# Patient Record
Sex: Female | Born: 1960 | Race: White | Hispanic: No | Marital: Single | Smoking: Never smoker
Health system: Southern US, Community
[De-identification: ages and names within clinical notes are randomized; demographics above are authoritative.]

## PROBLEM LIST (undated history)

## (undated) DIAGNOSIS — R011 Cardiac murmur, unspecified: Secondary | ICD-10-CM

## (undated) DIAGNOSIS — I341 Nonrheumatic mitral (valve) prolapse: Secondary | ICD-10-CM

## (undated) DIAGNOSIS — F329 Major depressive disorder, single episode, unspecified: Secondary | ICD-10-CM

## (undated) DIAGNOSIS — F419 Anxiety disorder, unspecified: Secondary | ICD-10-CM

## (undated) DIAGNOSIS — Z8679 Personal history of other diseases of the circulatory system: Secondary | ICD-10-CM

## (undated) DIAGNOSIS — J439 Emphysema, unspecified: Secondary | ICD-10-CM

## (undated) DIAGNOSIS — F32A Depression, unspecified: Secondary | ICD-10-CM

## (undated) DIAGNOSIS — K649 Unspecified hemorrhoids: Secondary | ICD-10-CM

## (undated) DIAGNOSIS — K219 Gastro-esophageal reflux disease without esophagitis: Secondary | ICD-10-CM

## (undated) HISTORY — DX: Major depressive disorder, single episode, unspecified: F32.9

## (undated) HISTORY — DX: Anxiety disorder, unspecified: F41.9

## (undated) HISTORY — DX: Emphysema, unspecified: J43.9

## (undated) HISTORY — DX: Unspecified hemorrhoids: K64.9

## (undated) HISTORY — PX: ELBOW SURGERY: SHX618

## (undated) HISTORY — DX: Personal history of other diseases of the circulatory system: Z86.79

## (undated) HISTORY — DX: Depression, unspecified: F32.A

## (undated) HISTORY — PX: NOSE SURGERY: SHX723

## (undated) HISTORY — DX: Nonrheumatic mitral (valve) prolapse: I34.1

## (undated) HISTORY — DX: Gastro-esophageal reflux disease without esophagitis: K21.9

## (undated) HISTORY — DX: Cardiac murmur, unspecified: R01.1

---

## 2006-04-23 ENCOUNTER — Other Ambulatory Visit: Admission: RE | Admit: 2006-04-23 | Discharge: 2006-04-23 | Payer: Self-pay | Admitting: Gynecology

## 2006-05-22 ENCOUNTER — Encounter: Admission: RE | Admit: 2006-05-22 | Discharge: 2006-05-22 | Payer: Self-pay | Admitting: Gynecology

## 2006-05-30 ENCOUNTER — Encounter: Admission: RE | Admit: 2006-05-30 | Discharge: 2006-05-30 | Payer: Self-pay | Admitting: Gynecology

## 2008-12-05 ENCOUNTER — Encounter: Payer: Self-pay | Admitting: Cardiology

## 2008-12-11 ENCOUNTER — Encounter: Payer: Self-pay | Admitting: Cardiology

## 2008-12-29 ENCOUNTER — Ambulatory Visit: Payer: Self-pay | Admitting: Cardiology

## 2008-12-29 DIAGNOSIS — R011 Cardiac murmur, unspecified: Secondary | ICD-10-CM | POA: Insufficient documentation

## 2009-01-12 ENCOUNTER — Ambulatory Visit (HOSPITAL_COMMUNITY): Admission: RE | Admit: 2009-01-12 | Discharge: 2009-01-12 | Payer: Self-pay | Admitting: Cardiology

## 2009-01-12 ENCOUNTER — Ambulatory Visit: Payer: Self-pay

## 2009-01-12 ENCOUNTER — Ambulatory Visit: Payer: Self-pay | Admitting: Cardiology

## 2009-01-12 ENCOUNTER — Encounter: Payer: Self-pay | Admitting: Cardiology

## 2009-06-24 ENCOUNTER — Emergency Department (HOSPITAL_COMMUNITY): Admission: EM | Admit: 2009-06-24 | Discharge: 2009-06-24 | Payer: Self-pay | Admitting: Emergency Medicine

## 2010-02-25 ENCOUNTER — Ambulatory Visit: Payer: Self-pay | Admitting: Cardiology

## 2010-03-14 ENCOUNTER — Telehealth: Payer: Self-pay | Admitting: Cardiology

## 2010-03-16 ENCOUNTER — Telehealth: Payer: Self-pay | Admitting: Cardiology

## 2010-03-18 ENCOUNTER — Ambulatory Visit (HOSPITAL_COMMUNITY)
Admission: RE | Admit: 2010-03-18 | Discharge: 2010-03-18 | Payer: Self-pay | Source: Home / Self Care | Attending: Cardiology | Admitting: Cardiology

## 2010-03-18 ENCOUNTER — Ambulatory Visit: Admission: RE | Admit: 2010-03-18 | Discharge: 2010-03-18 | Payer: Self-pay | Source: Home / Self Care

## 2010-03-21 ENCOUNTER — Telehealth: Payer: Self-pay | Admitting: Cardiology

## 2010-03-31 NOTE — Progress Notes (Signed)
Summary: pt rtn your call  Phone Note Call from Patient Call back at Home Phone [phone]   Caller: Patient Reason for Call: Talk to Nurse, Talk to Doctor, Lab or Test Results Summary of Call: pt rtn your call Initial call taken by: Omer Jack,  March 21, 2010 4:01 PM  Follow-up for Phone Call        pt given echo results from 03/18/10

## 2010-03-31 NOTE — Assessment & Plan Note (Signed)
Summary: [id number].gd   Visit Type:  Follow-up   History of Present Illness: 50 yo with history of cardiac murmur and mitral valve prolapse returns for followup.  Patient has had a cardiac murmur that has been known for about 20 years.  It was initially found when she was pregnant.  She has had a cardiac workup prior in [place] about 4 years ago.  She had chest pain and was worked up with a stress test that per her report was negative.  She has excellent exercise tolerance.  No significant exertional chest pain or shortness of breath.  Patient does have GERD symptoms after certain foods such as tomatoes.  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    Labs (10/10): K 4.7, creatinine 0.77, LDL 72, HDL 70, TGs 87  Current Medications (verified): 1)  Oxycodone-Acetaminophen .... Uad 2)  Prilosec 40 Mg Cpdr (Omeprazole) .... Once Daily  Allergies (verified): No Known Drug Allergies  Past History:  Past Medical History: 1. GERD 2. 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   Family History: Reviewed history from 12/29/2008 and no changes required. No premature CAD  Social History: Reviewed history from 12/29/2008 and no changes required. Never smoked, lives in [place], works for [employer], one child  Vital Signs:  Patient profile:   50 year old female Height:      68 inches Weight:      123 pounds BMI:     18.77 Pulse rate:   68 / minute BP sitting:   88 / 60  (left arm)  Vitals Entered By: Laurance Flatten CMA (February 25, 2010 9:27 AM)  Physical Exam  General:  Well developed, well nourished, in no acute distress. Neck:  Neck supple, no JVD. No masses, thyromegaly or abnormal cervical nodes. Lungs:  Clear bilaterally to auscultation and percussion. Heart:  Non-displaced PMI, chest non-tender; regular rate and rhythm, S1, S2 without rubs or gallops. There is a systolic click  with as slight mid to late systolic murmur at the apex.  Carotid upstroke normal, no bruit.  Pedals normal pulses. No edema, no varicosities. Abdomen:  Bowel sounds positive; abdomen soft and non-tender without masses, organomegaly, or hernias noted. No hepatosplenomegaly. Extremities:  No clubbing or cyanosis. Neurologic:  Alert and oriented x 3. Psych:  Normal affect.   Impression & Recommendations:  Problem # 1:  CARDIAC MURMUR (ICD-785.2) MItral valve prolapse with trivial MR.  Would followup echo in 11/12 (2-year interval).  May otherwise followup as needed.   Patient Instructions: 1)  Your physician recommends that you schedule a follow-up appointment as needed 2)  Your physician has requested that you have an echocardiogram in November 2012 for MVP/MR.  Echocardiography is a painless test that uses sound waves to create images of your heart. It provides your doctor with information about the size and shape of your heart and how well your heart's chambers and valves are working.  This procedure takes approximately one hour. There are no restrictions for this procedure.

## 2010-03-31 NOTE — Progress Notes (Signed)
Summary: wants to be set up -ekg  Phone Note Call from Patient Call back at Home Phone [phone]   Caller: Patient Reason for Call: Talk to Nurse Summary of Call: wants to be set up for ekg.  Initial call taken by: Lorne Skeens,  March 14, 2010 1:08 PM  Follow-up for Phone Call        [facility] Joyce Weaver --I talked with pt --pt saw Dr Shirlee Latch 02/25/10--he recommended pt have repeat echo November 2012-(-Pt had last  echo 01/12/09)--pt requesting to have echo now because she has insurance and she is not sure she is going to have insurance November  2012--I will forward to Dr Shirlee Latch for review     Appended Document: wants to be set up -ekg That will be fine if she thinks she will lose her insurance.   Appended Document: wants to be set up -ekg see phone note 03/16/10--echo scheduled for 03/18/10

## 2010-03-31 NOTE — Progress Notes (Signed)
Summary: Echo scheduled for 03/18/10  Phone Note Call from Patient Call back at Home Phone [phone]   Caller: Patient Reason for Call: Talk to Nurse Summary of Call: pt wants to talk to a nurse. pt does not want to leave a message with scheduler pt states she only wants to talk to a nurse. Initial call taken by: Roe Coombs,  March 16, 2010 10:58 AM  Follow-up for Phone Call        Returned call to patient-patient wants to have Echo now instead of wait until November 2012 due to losing insurance at end of month.  Appt scheduled for Echo on Friday 03/18/10 at 3pm and per [place] her insurance does not require her to pay a co-pay. Follow-up by: Dessie Coma  LPN,  March 16, 2010 11:27 AM

## 2010-05-17 LAB — POCT I-STAT, CHEM 8
Calcium, Ion: 1.22 mmol/L (ref 1.12–1.32)
Glucose, Bld: 127 mg/dL — ABNORMAL HIGH (ref 70–99)
HCT: 43 % (ref 36.0–46.0)
Hemoglobin: 14.6 g/dL (ref 12.0–15.0)
TCO2: 26 mmol/L (ref 0–100)

## 2010-10-11 ENCOUNTER — Encounter: Payer: Self-pay | Admitting: Cardiology

## 2012-04-04 ENCOUNTER — Other Ambulatory Visit: Payer: Self-pay | Admitting: Obstetrics and Gynecology

## 2012-04-04 DIAGNOSIS — R928 Other abnormal and inconclusive findings on diagnostic imaging of breast: Secondary | ICD-10-CM

## 2013-01-01 ENCOUNTER — Ambulatory Visit (INDEPENDENT_AMBULATORY_CARE_PROVIDER_SITE_OTHER): Payer: BC Managed Care – PPO | Admitting: Podiatry

## 2013-01-01 ENCOUNTER — Encounter: Payer: Self-pay | Admitting: Podiatry

## 2013-01-01 ENCOUNTER — Ambulatory Visit (INDEPENDENT_AMBULATORY_CARE_PROVIDER_SITE_OTHER): Payer: BC Managed Care – PPO

## 2013-01-01 VITALS — BP 104/60 | HR 71 | Resp 16 | Ht 68.0 in | Wt 130.0 lb

## 2013-01-01 DIAGNOSIS — M722 Plantar fascial fibromatosis: Secondary | ICD-10-CM

## 2013-01-01 DIAGNOSIS — M79675 Pain in left toe(s): Secondary | ICD-10-CM

## 2013-01-01 DIAGNOSIS — M79609 Pain in unspecified limb: Secondary | ICD-10-CM

## 2013-01-01 DIAGNOSIS — G589 Mononeuropathy, unspecified: Secondary | ICD-10-CM

## 2013-01-01 NOTE — Progress Notes (Signed)
Subjective:     Patient ID: Joyce Weaver, female   DOB: April 10, 1960, 52 y.o.   MRN: [id number]  Toe Pain    patient points to the bottom of her left big toe states it has been tingly and down and she's not sure why. States has been there for a while but it has gotten more tingly in the last 4 weeks also complains of long-term plantar fasciitis right heel over left   Review of Systems  All other systems reviewed and are negative.       Objective:   Physical Exam  Nursing note and vitals reviewed. Constitutional: She is oriented to person, place, and time.  Cardiovascular: Intact distal pulses.   Abdominal: Tenderness: patient is found to have no loss of motion of the first MPJ both feet. No indications of nerve loss sharp vibratory intact bilateral and also muscle strength found to be normal and symmetrical on both feet.  Musculoskeletal: Normal range of motion.  Neurological: She is oriented to person, place, and time.  Skin: Skin is warm.       Assessment:    possible nerve contusion left versus possibility of back eructation or other neurological issue. Plantar fasciitis of a low-grade both feet    Plan:     H&P and x-rays reviewed. At this point I have recommended stretching exercises which were given to patient and monitoring her left big toe hopeful that we'll gradually get better scanned for custom orthotic devices at this time

## 2013-01-01 NOTE — Progress Notes (Signed)
  Subjective:    Patient ID: Joyce Weaver, female    DOB: 06-28-1960, 53 y.o.   MRN: [id number]  HPI Comments: Plantar 1st toe left  N - numbness, tingling L - Plantar 1st toe left D - 52yr O - gradual C - noticed worsened in [place] doing a lot of walking in flip flops, no injury A - walking T - none  **Pt states she does have plantar fasciitis that comes and goes on right foot**      Review of Systems  HENT: Positive for sinus pressure, sore throat and trouble swallowing.   Respiratory: Positive for cough.   Musculoskeletal: Positive for back pain and myalgias.  Neurological: Positive for headaches.       Objective:   Physical Exam        Assessment & Plan:

## 2013-01-01 NOTE — Patient Instructions (Signed)
Plantar Fasciitis (Heel Spur Syndrome)  with Rehab  The plantar fascia is a fibrous, ligament-like, soft-tissue structure that spans the bottom of the foot. Plantar fasciitis is a condition that causes pain in the foot due to inflammation of the tissue.  SYMPTOMS   · Pain and tenderness on the underneath side of the foot.  · Pain that worsens with standing or walking.  CAUSES   Plantar fasciitis is caused by irritation and injury to the plantar fascia on the underneath side of the foot. Common mechanisms of injury include:  · Direct trauma to bottom of the foot.  · Damage to a small nerve that runs under the foot where the main fascia attaches to the heel bone.  · Stress placed on the plantar fascia due to bone spurs.  RISK INCREASES WITH:   · Activities that place stress on the plantar fascia (running, jumping, pivoting, or cutting).  · Poor strength and flexibility.  · Improperly fitted shoes.  · Tight calf muscles.  · Flat feet.  · Failure to warm-up properly before activity.  · Obesity.  PREVENTION  · Warm up and stretch properly before activity.  · Allow for adequate recovery between workouts.  · Maintain physical fitness:  · Strength, flexibility, and endurance.  · Cardiovascular fitness.  · Maintain a health body weight.  · Avoid stress on the plantar fascia.  · Wear properly fitted shoes, including arch supports for individuals who have flat feet.  PROGNOSIS   If treated properly, then the symptoms of plantar fasciitis usually resolve without surgery. However, occasionally surgery is necessary.  RELATED COMPLICATIONS   · Recurrent symptoms that may result in a chronic condition.  · Problems of the lower back that are caused by compensating for the injury, such as limping.  · Pain or weakness of the foot during push-off following surgery.  · Chronic inflammation, scarring, and partial or complete fascia tear, occurring more often from repeated injections.  TREATMENT   Treatment initially involves the use of  ice and medication to help reduce pain and inflammation. The use of strengthening and stretching exercises may help reduce pain with activity, especially stretches of the Achilles tendon. These exercises may be performed at home or with a therapist. Your caregiver may recommend that you use heel cups of arch supports to help reduce stress on the plantar fascia. Occasionally, corticosteroid injections are given to reduce inflammation. If symptoms persist for greater than 6 months despite non-surgical (conservative), then surgery may be recommended.   MEDICATION   · If pain medication is necessary, then nonsteroidal anti-inflammatory medications, such as aspirin and ibuprofen, or other minor pain relievers, such as acetaminophen, are often recommended.  · Do not take pain medication within 7 days before surgery.  · Prescription pain relievers may be given if deemed necessary by your caregiver. Use only as directed and only as much as you need.  · Corticosteroid injections may be given by your caregiver. These injections should be reserved for the most serious cases, because they may only be given a certain number of times.  HEAT AND COLD  · Cold treatment (icing) relieves pain and reduces inflammation. Cold treatment should be applied for 10 to 15 minutes every 2 to 3 hours for inflammation and pain and immediately after any activity that aggravates your symptoms. Use ice packs or massage the area with a piece of ice (ice massage).  · Heat treatment may be used prior to performing the stretching and strengthening activities prescribed   by your caregiver, physical therapist, or athletic trainer. Use a heat pack or soak the injury in warm water.  SEEK IMMEDIATE MEDICAL CARE IF:  · Treatment seems to offer no benefit, or the condition worsens.  · Any medications produce adverse side effects.  EXERCISES  RANGE OF MOTION (ROM) AND STRETCHING EXERCISES - Plantar Fasciitis (Heel Spur Syndrome)  These exercises may help you  when beginning to rehabilitate your injury. Your symptoms may resolve with or without further involvement from your physician, physical therapist or athletic trainer. While completing these exercises, remember:   · Restoring tissue flexibility helps normal motion to return to the joints. This allows healthier, less painful movement and activity.  · An effective stretch should be held for at least 30 seconds.  · A stretch should never be painful. You should only feel a gentle lengthening or release in the stretched tissue.  RANGE OF MOTION - Toe Extension, Flexion  · Sit with your right / left leg crossed over your opposite knee.  · Grasp your toes and gently pull them back toward the top of your foot. You should feel a stretch on the bottom of your toes and/or foot.  · Hold this stretch for __________ seconds.  · Now, gently pull your toes toward the bottom of your foot. You should feel a stretch on the top of your toes and or foot.  · Hold this stretch for __________ seconds.  Repeat __________ times. Complete this stretch __________ times per day.   RANGE OF MOTION - Ankle Dorsiflexion, Active Assisted  · Remove shoes and sit on a chair that is preferably not on a carpeted surface.  · Place right / left foot under knee. Extend your opposite leg for support.  · Keeping your heel down, slide your right / left foot back toward the chair until you feel a stretch at your ankle or calf. If you do not feel a stretch, slide your bottom forward to the edge of the chair, while still keeping your heel down.  · Hold this stretch for __________ seconds.  Repeat __________ times. Complete this stretch __________ times per day.   STRETCH  Gastroc, Standing  · Place hands on wall.  · Extend right / left leg, keeping the front knee somewhat bent.  · Slightly point your toes inward on your back foot.  · Keeping your right / left heel on the floor and your knee straight, shift your weight toward the wall, not allowing your back to  arch.  · You should feel a gentle stretch in the right / left calf. Hold this position for __________ seconds.  Repeat __________ times. Complete this stretch __________ times per day.  STRETCH  Soleus, Standing  · Place hands on wall.  · Extend right / left leg, keeping the other knee somewhat bent.  · Slightly point your toes inward on your back foot.  · Keep your right / left heel on the floor, bend your back knee, and slightly shift your weight over the back leg so that you feel a gentle stretch deep in your back calf.  · Hold this position for __________ seconds.  Repeat __________ times. Complete this stretch __________ times per day.  STRETCH  Gastrocsoleus, Standing   Note: This exercise can place a lot of stress on your foot and ankle. Please complete this exercise only if specifically instructed by your caregiver.   · Place the ball of your right / left foot on a step, keeping   your other foot firmly on the same step.  · Hold on to the wall or a rail for balance.  · Slowly lift your other foot, allowing your body weight to press your heel down over the edge of the step.  · You should feel a stretch in your right / left calf.  · Hold this position for __________ seconds.  · Repeat this exercise with a slight bend in your right / left knee.  Repeat __________ times. Complete this stretch __________ times per day.   STRENGTHENING EXERCISES - Plantar Fasciitis (Heel Spur Syndrome)   These exercises may help you when beginning to rehabilitate your injury. They may resolve your symptoms with or without further involvement from your physician, physical therapist or athletic trainer. While completing these exercises, remember:   · Muscles can gain both the endurance and the strength needed for everyday activities through controlled exercises.  · Complete these exercises as instructed by your physician, physical therapist or athletic trainer. Progress the resistance and repetitions only as guided.  STRENGTH - Towel  Curls  · Sit in a chair positioned on a non-carpeted surface.  · Place your foot on a towel, keeping your heel on the floor.  · Pull the towel toward your heel by only curling your toes. Keep your heel on the floor.  · If instructed by your physician, physical therapist or athletic trainer, add ____________________ at the end of the towel.  Repeat __________ times. Complete this exercise __________ times per day.  STRENGTH - Ankle Inversion  · Secure one end of a rubber exercise band/tubing to a fixed object (table, pole). Loop the other end around your foot just before your toes.  · Place your fists between your knees. This will focus your strengthening at your ankle.  · Slowly, pull your big toe up and in, making sure the band/tubing is positioned to resist the entire motion.  · Hold this position for __________ seconds.  · Have your muscles resist the band/tubing as it slowly pulls your foot back to the starting position.  Repeat __________ times. Complete this exercises __________ times per day.   Document Released: 02/13/2005 Document Revised: 05/08/2011 Document Reviewed: 05/28/2008  [employer]® Patient Information ©2014 [employer].

## 2013-01-02 ENCOUNTER — Encounter (INDEPENDENT_AMBULATORY_CARE_PROVIDER_SITE_OTHER): Payer: Self-pay | Admitting: Surgery

## 2013-01-03 ENCOUNTER — Ambulatory Visit: Payer: Self-pay | Admitting: Podiatrist

## 2013-01-06 ENCOUNTER — Other Ambulatory Visit (INDEPENDENT_AMBULATORY_CARE_PROVIDER_SITE_OTHER): Payer: Self-pay | Admitting: Surgery

## 2013-01-06 ENCOUNTER — Ambulatory Visit (INDEPENDENT_AMBULATORY_CARE_PROVIDER_SITE_OTHER): Payer: BC Managed Care – PPO | Admitting: Surgery

## 2013-01-06 ENCOUNTER — Encounter (INDEPENDENT_AMBULATORY_CARE_PROVIDER_SITE_OTHER): Payer: Self-pay | Admitting: Surgery

## 2013-01-06 VITALS — BP 102/62 | HR 80 | Temp 98.6°F | Resp 14 | Ht 68.0 in | Wt 130.0 lb

## 2013-01-06 DIAGNOSIS — T189XXA Foreign body of alimentary tract, part unspecified, initial encounter: Secondary | ICD-10-CM

## 2013-01-06 DIAGNOSIS — IMO0002 Reserved for concepts with insufficient information to code with codable children: Secondary | ICD-10-CM

## 2013-01-06 NOTE — Progress Notes (Signed)
Subjective:     Patient ID: Joyce Weaver, female   DOB: [birth year], 52 y.o.   MRN: [id number]  HPI This is a pleasant female who was referred to me by the urologists Dr. Sherron Monday because of a recent CAT scan demonstrating a foreign body in the ascending colon as well as a intussusception of the small bowel. The patient denies ingesting a foreign body. She has absolutely no abdominal pain. This was a noncontrasted CAT scan  Review of Systems     Objective:   Physical Exam On exam, her abdomen is soft and nontender    Assessment:     Foreign body in the bowel     Plan:     I suspected by now she is passes foreign body. We will check a KUB to see if it has passed. If it has not, she will need to see her gastroenterologist for consideration of a repeat colonoscopy. I am not worried about intussusception of the small bowel as I suspect in the noncontrasted scan this was truly not present. We will notify her of the results of the KUB

## 2013-01-09 ENCOUNTER — Ambulatory Visit
Admission: RE | Admit: 2013-01-09 | Discharge: 2013-01-09 | Disposition: A | Discharge status: Home / Self Care | Payer: BC Managed Care – PPO | Source: Ambulatory Visit | Attending: Surgery | Admitting: Surgery

## 2013-01-09 DIAGNOSIS — IMO0002 Reserved for concepts with insufficient information to code with codable children: Secondary | ICD-10-CM

## 2013-01-10 ENCOUNTER — Telehealth (INDEPENDENT_AMBULATORY_CARE_PROVIDER_SITE_OTHER): Payer: Self-pay | Admitting: General Surgery

## 2013-01-10 ENCOUNTER — Other Ambulatory Visit (INDEPENDENT_AMBULATORY_CARE_PROVIDER_SITE_OTHER): Payer: Self-pay | Admitting: Surgery

## 2013-01-10 ENCOUNTER — Telehealth (INDEPENDENT_AMBULATORY_CARE_PROVIDER_SITE_OTHER): Payer: Self-pay | Admitting: *Deleted

## 2013-01-10 ENCOUNTER — Encounter: Payer: Self-pay | Admitting: *Deleted

## 2013-01-10 ENCOUNTER — Encounter: Payer: Self-pay | Admitting: Gastroenterology

## 2013-01-10 DIAGNOSIS — IMO0002 Reserved for concepts with insufficient information to code with codable children: Secondary | ICD-10-CM

## 2013-01-10 NOTE — Telephone Encounter (Signed)
Also please inform patient that Dr. Jarold Motto is retiring at the end of the year but she would be placed in the care of another physician at that time.

## 2013-01-10 NOTE — Telephone Encounter (Signed)
Pt called back and I informed her of her referral to a GI doctor for a colonoscopy.  She states that she had a previous colonoscopy at [facility] in [place]; however, she would like to go somewhere here in [place] which is closer to her.

## 2013-01-10 NOTE — Telephone Encounter (Signed)
LMOM for pt to return my call.  I was calling to inform her of her appt with Dr. Jarold Motto with [facility] on 01/14/13 with an arrival time of 9:00am.  They are located at [address].  Their phone number is [phone] if she needs to reschedule.  Please inform pt that she needs to come pick up the CD with her CT images from [facility] to take with her to [facility].

## 2013-01-10 NOTE — Telephone Encounter (Signed)
LMOM o have patient to call back and ask for [facility]. I need to let the patient know that she still has the foreign body in her colon, size 1.4 cm and she will need to be refer to a GI Doctor for a colonoscopy.

## 2013-01-13 NOTE — Telephone Encounter (Signed)
Pt returned my call and appt information below was given as well as instructions for her to have [facility] send a CD with the copy of the CT scan images over to [facility].  Pt is going to call [facility] to change appt because the appt given does not work with her schedule.

## 2013-01-14 ENCOUNTER — Ambulatory Visit: Payer: BC Managed Care – PPO | Admitting: Gastroenterology

## 2013-01-16 ENCOUNTER — Encounter: Payer: Self-pay | Admitting: Gastroenterology

## 2013-01-16 ENCOUNTER — Ambulatory Visit (INDEPENDENT_AMBULATORY_CARE_PROVIDER_SITE_OTHER): Payer: BC Managed Care – PPO | Admitting: Gastroenterology

## 2013-01-16 VITALS — BP 110/70 | HR 71 | Ht 68.0 in | Wt 130.6 lb

## 2013-01-16 DIAGNOSIS — T17900A Unspecified foreign body in respiratory tract, part unspecified causing asphyxiation, initial encounter: Secondary | ICD-10-CM

## 2013-01-16 DIAGNOSIS — Z86010 Personal history of colonic polyps: Secondary | ICD-10-CM

## 2013-01-16 DIAGNOSIS — T183XXA Foreign body in small intestine, initial encounter: Secondary | ICD-10-CM

## 2013-01-16 MED ORDER — NA SULFATE-K SULFATE-MG SULF 17.5-3.13-1.6 GM/177ML PO SOLN: ORAL | Status: DC

## 2013-01-16 NOTE — Patient Instructions (Signed)

## 2013-01-16 NOTE — Progress Notes (Addendum)
History of Present Illness:  This is a 52 year old  Caucasian female who was found to have a foreign body in her right lower quadrant on recent KUB performed after CT scan of the abdomen was done because of hematuria. Sub-note that she had a negative colonoscopy earlier this year and [place] except for some small polyps that were removed. We do not have that report for review. She denies abdominal pain, history of foreign body ingestion, upper GI or hepatobiliary symptoms. His regular bowel movements without melena or hematochezia. She uses when necessary omeprazole for GERD. She is status post C-section.  I have reviewed this patient's present history, medical and surgical past history, allergies and medications.     ROS:   All systems were reviewed and are negative unless otherwise stated in the HPI.    Physical Exam: Blood pressure 110/70, pulse 71 and regular and weight 1:30 General well developed well nourished patient in no acute distress, appearing their stated age Eyes PERRLA, no icterus, fundoscopic exam per opthamologist Skin no lesions noted Neck supple, no adenopathy, no thyroid enlargement, no tenderness Chest clear to percussion and auscultation Heart no significant murmurs, gallops or rubs noted Abdomen no hepatosplenomegaly masses or tenderness, BS normal. . Extremities no acute joint lesions, edema, phlebitis or evidence of cellulitis. Neurologic patient oriented x 3, cranial nerves intact, no focal neurologic deficits noted. Psychological mental status normal and normal affect.  Assessment and plan: Review of her radiographs with the patient does show what appears to be a foreign body in the right lower quadrant area of the cecum. She cannot recall ingestion of foreign body. We will go ahead and repeat her colonoscopy with a history of colon polyps and proceed accordingly. She has not had serious GERD, I do not think endoscopy is needed. She is in excellent health overall.  I have asked her to return her colonoscopy report from her previous doctors if possible. Actually we received her colonoscopy reported appears that she had a Hemoclip placed her right colon after saline injection and removal of a polyp. I suspect this is what we're seeing on the KUB.

## 2013-01-17 ENCOUNTER — Encounter: Payer: Self-pay | Admitting: Gastroenterology

## 2013-01-17 ENCOUNTER — Telehealth: Payer: Self-pay | Admitting: *Deleted

## 2013-01-17 NOTE — Telephone Encounter (Signed)
Patient called and states that she has a scratchy throat and wants to know if she can still have colonoscopy Monday. I asked patient if she was running any fever or has been congested.  She denies either.  I advised her that as long as she doesn't fun fever or get worse throughout the weekend it would be fine to proceed with procedure.  I also advised her to be sure she calls and cancels if symptoms do worsen.  She agreed and will do so.

## 2013-01-20 ENCOUNTER — Ambulatory Visit (AMBULATORY_SURGERY_CENTER): Payer: BC Managed Care – PPO | Admitting: Gastroenterology

## 2013-01-20 ENCOUNTER — Encounter: Payer: Self-pay | Admitting: Gastroenterology

## 2013-01-20 VITALS — BP 112/64 | HR 58 | Temp 97.6°F | Resp 24 | Ht 68.0 in | Wt 130.0 lb

## 2013-01-20 DIAGNOSIS — Z86010 Personal history of colonic polyps: Secondary | ICD-10-CM

## 2013-01-20 DIAGNOSIS — D126 Benign neoplasm of colon, unspecified: Secondary | ICD-10-CM

## 2013-01-20 DIAGNOSIS — T17900D Unspecified foreign body in respiratory tract, part unspecified causing asphyxiation, subsequent encounter: Secondary | ICD-10-CM

## 2013-01-20 MED ORDER — SODIUM CHLORIDE 0.9 % IV SOLN: 500.0000 mL | INTRAVENOUS | Status: DC

## 2013-01-20 NOTE — Patient Instructions (Signed)
YOU HAD AN ENDOSCOPIC PROCEDURE TODAY AT THE [facility]: Refer to the procedure report that was given to you for any specific questions about what was found during the examination.  If the procedure report does not answer your questions, please call your gastroenterologist to clarify.  If you requested that your care partner not be given the details of your procedure findings, then the procedure report has been included in a sealed envelope for you to review at your convenience later.  YOU SHOULD EXPECT: Some feelings of bloating in the abdomen. Passage of more gas than usual.  Walking can help get rid of the air that was put into your GI tract during the procedure and reduce the bloating. If you had a lower endoscopy (such as a colonoscopy or flexible sigmoidoscopy) you may notice spotting of blood in your stool or on the toilet paper. If you underwent a bowel prep for your procedure, then you may not have a normal bowel movement for a few days.  DIET: Your first meal following the procedure should be a light meal and then it is ok to progress to your normal diet.  A half-sandwich or bowl of soup is an example of a good first meal.  Heavy or fried foods are harder to digest and may make you feel nauseous or bloated.  Likewise meals heavy in dairy and vegetables can cause extra gas to form and this can also increase the bloating.  Drink plenty of fluids but you should avoid alcoholic beverages for 24 hours.  ACTIVITY: Your care partner should take you home directly after the procedure.  You should plan to take it easy, moving slowly for the rest of the day.  You can resume normal activity the day after the procedure however you should NOT DRIVE or use heavy machinery for 24 hours (because of the sedation medicines used during the test).    SYMPTOMS TO REPORT IMMEDIATELY: A gastroenterologist can be reached at any hour.  During normal business hours, 8:30 AM to 5:00 PM Monday through Friday,  call [phone].  After hours and on weekends, please call the GI answering service at [phone] who will take a message and have the physician on call contact you.   Following lower endoscopy (colonoscopy or flexible sigmoidoscopy):  Excessive amounts of blood in the stool  Significant tenderness or worsening of abdominal pains  Swelling of the abdomen that is new, acute  Fever of 100F or higher \ FOLLOW UP: If any biopsies were taken you will be contacted by phone or by letter within the next 1-3 weeks.  Call your gastroenterologist if you have not heard about the biopsies in 3 weeks.  Our staff will call the home number listed on your records the next business day following your procedure to check on you and address any questions or concerns that you may have at that time regarding the information given to you following your procedure. This is a courtesy call and so if there is no answer at the home number and we have not heard from you through the emergency physician on call, we will assume that you have returned to your regular daily activities without incident.  SIGNATURES/CONFIDENTIALITY: You and/or your care partner have signed paperwork which will be entered into your electronic medical record.  These signatures attest to the fact that that the information above on your After Visit Summary has been reviewed and is understood.  Full responsibility of the confidentiality of this   discharge information lies with you and/or your care-partner.  Polyp information given.  Repeat colonscopy in 5 years. 

## 2013-01-20 NOTE — Progress Notes (Signed)
Patient did not experience any of the following events: a burn prior to discharge; a fall within the facility; wrong site/side/patient/procedure/implant event; or a hospital transfer or hospital admission upon discharge from the facility. (G8907)Patient did not have preoperative order for IV antibiotic SSI prophylaxis. [id number]  Pt. Had very small area of swelling from IV site.  Warm compress applied

## 2013-01-20 NOTE — Op Note (Signed)
[facility] [address] [place], [address]   COLONOSCOPY PROCEDURE REPORT  PATIENT: Joyce Weaver, Joyce Weaver  MR#: [id number] BIRTHDATE: Sep 20, 1960 , 52  yrs. old GENDER: Female ENDOSCOPIST: Mardella Layman, MD, [facility] REFERRED BY: PROCEDURE DATE:  01/20/2013 PROCEDURE:   Colonoscopy with snare polypectomy First Screening Colonoscopy - Avg.  risk and is 50 yrs.  old or older - No.  Prior Negative Screening - Now for repeat screening. N/A  History of Adenoma - Now for follow-up colonoscopy & has been > or = to 3 yrs.  Yes hx of adenoma.  Has been 3 or more years since last colonoscopy.  Polyps Removed Today? Yes. ASA CLASS:   Class II INDICATIONS:?? foreign body in cecum. MEDICATIONS: Propofol (Diprivan) 370  DESCRIPTION OF PROCEDURE:   After the risks benefits and alternatives of the procedure were thoroughly explained, informed consent was obtained.  A digital rectal exam revealed no abnormalities of the rectum.   The LB [id number]  endoscope was introduced through the anus and advanced to the cecum, which was identified by both the appendix and ileocecal valve. No adverse events experienced.   Limited by poor preparation.   The quality of the prep was poor, using MoviPrep  The instrument was then slowly withdrawn as the colon was fully examined.      COLON FINDINGS: A fungating semi-pedunculated polyp ranging between 5-68mm in size with a friable surface was found in the sigmoid colon.  A polypectomy was performed using snare cautery.  The resection was complete and the polyp tissue was completely retrieved.POOR PREP IN CECUM BUT NO FOREIGN BODY OR CLIP SEEN. Retroflexed views revealed no abnormalities. The time to cecum=3 minutes 13 seconds.  Withdrawal time=12 minutes 2 seconds.  The scope was withdrawn and the procedure completed. COMPLICATIONS: There were no complications.  ENDOSCOPIC IMPRESSION: Semi-pedunculated polyp ranging between 5-58mm in size  was found in the sigmoid colon; polypectomy was performed using snare cautery .NO FOREIGN BODY SEEN OR HEMOCLIP IN CECUM,BUT POOR PREP !!!!  RECOMMENDATIONS: 1.  Await pathology results 2.  Repeat Colonoscopy in 5 years.   eSigned:  Mardella Layman, MD, [facility] 01/20/2013 11:10 AM   cc:   PATIENT NAME:  [name], [name] MR#: [id number]

## 2013-01-20 NOTE — Progress Notes (Signed)
Called to room to assist during endoscopic procedure.  Patient ID and intended procedure confirmed with present staff. Received instructions for my participation in the procedure from the performing physician.  

## 2013-01-20 NOTE — Progress Notes (Signed)
Report to pacu rn, vss, bbs=clear 

## 2013-01-21 ENCOUNTER — Telehealth: Payer: Self-pay

## 2013-01-21 ENCOUNTER — Encounter (INDEPENDENT_AMBULATORY_CARE_PROVIDER_SITE_OTHER): Payer: Self-pay

## 2013-01-21 NOTE — Telephone Encounter (Signed)
Left message on answering machine. 

## 2013-01-27 ENCOUNTER — Encounter: Payer: Self-pay | Admitting: Gastroenterology

## 2013-01-30 ENCOUNTER — Ambulatory Visit: Payer: Self-pay | Admitting: Podiatrist

## 2013-03-12 ENCOUNTER — Other Ambulatory Visit: Payer: Self-pay | Admitting: Obstetrics and Gynecology

## 2013-07-02 ENCOUNTER — Ambulatory Visit: Payer: BC Managed Care – PPO | Admitting: Diagnostic Neuroimaging

## 2013-07-07 ENCOUNTER — Encounter (INDEPENDENT_AMBULATORY_CARE_PROVIDER_SITE_OTHER): Payer: Self-pay

## 2013-07-07 ENCOUNTER — Ambulatory Visit: Payer: BC Managed Care – PPO | Admitting: Diagnostic Neuroimaging

## 2013-07-10 ENCOUNTER — Encounter (INDEPENDENT_AMBULATORY_CARE_PROVIDER_SITE_OTHER): Payer: Self-pay

## 2013-07-15 ENCOUNTER — Ambulatory Visit (INDEPENDENT_AMBULATORY_CARE_PROVIDER_SITE_OTHER): Payer: BC Managed Care – PPO | Admitting: Surgery

## 2013-08-06 ENCOUNTER — Encounter (INDEPENDENT_AMBULATORY_CARE_PROVIDER_SITE_OTHER): Payer: Self-pay | Admitting: General Surgery

## 2013-08-06 ENCOUNTER — Ambulatory Visit (INDEPENDENT_AMBULATORY_CARE_PROVIDER_SITE_OTHER): Payer: BC Managed Care – PPO | Admitting: General Surgery

## 2013-08-06 ENCOUNTER — Encounter (INDEPENDENT_AMBULATORY_CARE_PROVIDER_SITE_OTHER): Payer: Self-pay

## 2013-08-06 VITALS — BP 106/64 | HR 52 | Temp 97.7°F | Resp 14 | Ht 68.0 in | Wt 126.8 lb

## 2013-08-06 DIAGNOSIS — K6289 Other specified diseases of anus and rectum: Secondary | ICD-10-CM

## 2013-08-06 NOTE — Progress Notes (Signed)
Chief Complaint  Patient presents with  . Hemorrhoids    est pt new prob    HISTORY: Joyce Weaver is a 53 y.o. female who presents to the office with anal pain.  Other symptoms include nothing.  This had been occurring for 4 days.  she has tried ointments in the past with some success.  Sitting makes the symptoms worse.   It is intermittent in nature.  her bowel habits are regular and her bowel movements are occasionally hard.  her fiber intake is minimal.  her last colonoscopy was a few months ago. A couple polyps were found.    Past Medical History  Diagnosis Date  . GERD (gastroesophageal reflux disease)   . 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 Anxiety   . Personal history of other diseases of circulatory system   . Depression   . Emphysema of lung   . Heart murmur   . Hemorrhoids       Past Surgical History  Procedure Laterality Date  . Nose surgery    . Elbow surgery          Current Outpatient Prescriptions  Medication Sig Dispense Refill  . estradiol-norethindrone (ACTIVELLA) 1-0.5 MG per tablet Take 1 tablet by mouth daily.       No current facility-administered medications for this visit.      No Known Allergies    Family History  Problem Relation Age of Onset  . Coronary artery disease      no premature Hx  . Cancer Brother     prostate  . Cancer Maternal Aunt     breast  . Colon cancer Neg Hx   . Rectal cancer Neg Hx   . Stomach cancer Neg Hx   . Esophageal cancer Neg Hx     History   Social History  . Marital Status: Single    Spouse Name: N/A    Number of Children: 1  . Years of Education: N/A   Social History Main Topics  . Smoking status: Never Smoker   . Smokeless tobacco: Never Used  . Alcohol Use: No  . Drug Use: No  . Sexual Activity: None   Other Topics Concern  . None   Social History Narrative   Lives in [place]. Works for [employer] - PERTINENT POSITIVES ONLY: Review of Systems - General ROS: negative for - chills, fever or weight loss Hematological and Lymphatic ROS: negative for - bleeding problems, blood clots or bruising Respiratory ROS: no cough, shortness of breath, or wheezing Cardiovascular ROS: no chest pain or dyspnea on exertion Gastrointestinal ROS: no abdominal pain, change in bowel habits, or black or bloody stools Genito-Urinary ROS: no dysuria, trouble voiding, or hematuria  EXAM: Filed Vitals:   08/06/13 1357  BP: 106/64  Pulse: 52  Temp: 97.7 F (36.5 C)  Resp: 14    General appearance: alert and cooperative Resp: clear to auscultation bilaterally Cardio: regular rate and rhythm GI: normal findings: soft, non-tender  Procedure: Anoscopy Surgeon: Marcello Moores Diagnosis: anal pain  Assistant: Cherylann Parr After the risks and benefits were explained, verbal consent was obtained for above procedure  Anesthesia: none Findings: small abrasion in rectovaginal septum, no surrounding fluctuance, no sign of fistula, no hemorrhoid disease, no palpable masses    ASSESSMENT AND PLAN: Joyce Weaver is a 53 y.o. female with anal pain.  Will start a fiber supplement to help regulate her bowel movements.  Will call the office for re-examination if pain persists on fiber supplement.      Rosario Adie, MD Colon and Rectal Surgery / [facility]      Visit Diagnoses: No diagnosis found.  Primary Care Physician: Chesley Noon, MD

## 2013-08-06 NOTE — Patient Instructions (Signed)
GETTING TO GOOD BOWEL HEALTH. Irregular bowel habits such as constipation can lead to many problems over time.  Having one soft bowel movement a day is the most important way to prevent further problems.  The anorectal canal is designed to handle stretching and feces to safely manage our ability to get rid of solid waste (feces, poop, stool) out of our body.  BUT, hard constipated stools can act like ripping concrete bricks causing inflamed hemorrhoids, anal fissures, abdominal pain and bloating.     The goal: ONE SOFT BOWEL MOVEMENT A DAY!  To have soft, regular bowel movements:    Drink at least 8 tall glasses of water a day.     Take plenty of fiber.  Fiber is the undigested part of plant food that passes into the colon, acting s "natures broom" to encourage bowel motility and movement.  Fiber can absorb and hold large amounts of water. This results in a larger, bulkier stool, which is soft and easier to pass. Work gradually over several weeks up to 6 servings a day of fiber (25g a day even more if needed) in the form of: o Vegetables -- Root (potatoes, carrots, turnips), leafy green (lettuce, salad greens, celery, spinach), or cooked high residue (cabbage, broccoli, etc) o Fruit -- Fresh (unpeeled skin & pulp), Dried (prunes, apricots, cherries, etc ),  or stewed ( applesauce)  o Whole grain breads, pasta, etc (whole wheat)  o Bran cereals    Bulking Agents -- This type of water-retaining fiber generally is easily obtained each day by one of the following:  o Psyllium bran -- The psyllium plant is remarkable because its ground seeds can retain so much water. This product is available as Metamucil, Konsyl, Effersyllium, Per Diem Fiber, or the less expensive generic preparation in drug and health food stores. Although labeled a laxative, it really is not a laxative.  o Methylcellulose -- This is another fiber derived from wood which also retains water. It is available as Citrucel. o Polyethylene Glycol  - and "artificial" fiber commonly called Miralax or Glycolax.  It is helpful for people with gassy or bloated feelings with regular fiber o Flax Seed - a less gassy fiber than psyllium   No reading or other relaxing activity while on the toilet. If bowel movements take longer than 5 minutes, you are too constipated.   AVOID CONSTIPATION.  High fiber and water intake usually takes care of this.  Sometimes a laxative is needed to stimulate more frequent bowel movements, but    Laxatives are not a good long-term solution as it can wear the colon out. o Osmotics (Milk of Magnesia, Fleets phosphosoda, Magnesium citrate, MiraLax, GoLytely) are safer than  o Stimulants (Senokot, Castor Oil, Dulcolax, Ex Lax)    o Do not take laxatives for more than 7days in a row.    IF SEVERELY CONSTIPATED, try a Bowel Retraining Program: o Do not use laxatives.  o Eat a diet high in roughage, such as bran cereals and leafy vegetables.  o Drink six (6) ounces of prune or apricot juice each morning.  o Eat two (2) large servings of stewed fruit each day.  o Take one (1) heaping tablespoon of a psyllium-based bulking agent twice a day. Use sugar-free sweetener when possible to avoid excessive calories.  o Eat a normal breakfast.  o Set aside 15 minutes after breakfast to sit on the toilet, but do not strain to have a bowel movement.  o If you do  not have a bowel movement by the third day, use an enema and repeat the above steps.   Fiber Chart  You should 25-30g of fiber per day and drinking 8 glasses of water to help your bowels move regularly.  In the chart below you can look up how much fiber you are getting in an average day.  If you are not getting enough fiber, you should add a fiber supplement to your diet.  Examples of this include Metamucil, FiberCon and Citrucel.  These can be purchased at your local grocery store or pharmacy.       [email or web address]-guerrero.[email or web address]

## 2013-08-19 ENCOUNTER — Ambulatory Visit (INDEPENDENT_AMBULATORY_CARE_PROVIDER_SITE_OTHER): Payer: BC Managed Care – PPO | Admitting: Diagnostic Neuroimaging

## 2013-08-19 ENCOUNTER — Encounter: Payer: Self-pay | Admitting: Diagnostic Neuroimaging

## 2013-08-19 VITALS — BP 98/66 | HR 61 | Temp 98.2°F | Ht 67.5 in | Wt 127.0 lb

## 2013-08-19 DIAGNOSIS — R208 Other disturbances of skin sensation: Secondary | ICD-10-CM

## 2013-08-19 DIAGNOSIS — R209 Unspecified disturbances of skin sensation: Secondary | ICD-10-CM

## 2013-08-19 DIAGNOSIS — R200 Anesthesia of skin: Secondary | ICD-10-CM

## 2013-08-19 NOTE — Progress Notes (Signed)
[facility]  PATIENT: Joyce Weaver DOB: Apr 05, 1960  REFERRING CLINICIAN: Frederico Hamman HISTORY FROM: patient  REASON FOR VISIT: new consult   HISTORICAL  CHIEF COMPLAINT:  Chief Complaint  Patient presents with  . Numbness    tingling in  L foot    HISTORY OF PRESENT ILLNESS:   53 year old right-handed female here for evaluation of left toe numbness. Patient reports 3 years of left great toe numbness, pins and needles sensation. Symptoms are worse when she wears flip-flops or sandals or barefoot. Symptoms are better when she wears closed toed shoes. No symptoms in her right. No symptoms above her ankle. No significant low back pain. No lumbar radicular pain. No problems with her fingers or hands. Patient saw podiatrist who gave her a rubber pad for her great toe. Patient did not try this yet. No other specific triggering factors.  REVIEW OF SYSTEMS: Full 14 system review of systems performed and notable only for heart murmur. Otherwise negative.  ALLERGIES: No Known Allergies  HOME MEDICATIONS: Outpatient Prescriptions Prior to Visit  Medication Sig Dispense Refill  . estradiol-norethindrone (ACTIVELLA) 1-0.5 MG per tablet Take 1 tablet by mouth daily.       No facility-administered medications prior to visit.    PAST MEDICAL HISTORY: Past Medical History  Diagnosis Date  . GERD (gastroesophageal reflux disease)   . 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 Anxiety   . Personal history of other diseases of circulatory system   . Depression   . Emphysema of lung   . Heart murmur   . Hemorrhoids     PAST SURGICAL HISTORY: Past Surgical History  Procedure Laterality Date  . Nose surgery    . Elbow surgery      FAMILY HISTORY: Family History  Problem Relation Age of Onset  . Coronary artery disease      no premature Hx  . Cancer Brother     prostate  . Cancer Maternal Aunt     breast   . Colon cancer Neg Hx   . Rectal cancer Neg Hx   . Stomach cancer Neg Hx   . Esophageal cancer Neg Hx   . Other Father     natural  . Stroke Mother     SOCIAL HISTORY:  History   Social History  . Marital Status: Single    Spouse Name: N/A    Number of Children: 1  . Years of Education: 12th   Occupational History  .  [place] History Main Topics  . Smoking status: Never Smoker   . Smokeless tobacco: Never Used  . Alcohol Use: No  . Drug Use: No  . Sexual Activity: Not on file   Other Topics Concern  . Not on file   Social History Narrative   Lives in [place]. Works for [employer]   Patient lives at home with family.   Caffeine Use: occasionally     PHYSICAL EXAM  Filed Vitals:   08/19/13 0958  BP: 98/66  Pulse: 61  Temp: 98.2 F (36.8 C)  TempSrc: Oral  Height: 5' 7.5" (1.715 m)  Weight: 127 lb (57.607 kg)    Not recorded    Body mass index is 19.59 kg/(m^2).  GENERAL EXAM: Patient is in no distress; well developed, nourished and groomed; neck is supple  CARDIOVASCULAR: Regular rate and rhythm, no murmurs, no carotid bruits  NEUROLOGIC: MENTAL STATUS: awake, alert, oriented to person, place and time, recent and remote memory intact, normal attention and concentration, language fluent, comprehension intact, naming intact, fund of knowledge appropriate CRANIAL NERVE: no papilledema on fundoscopic exam, pupils equal and reactive to light, visual fields full to confrontation, extraocular muscles intact, no nystagmus, facial sensation and strength symmetric, hearing intact, palate elevates symmetrically, uvula midline, shoulder shrug symmetric, tongue midline. MOTOR: normal bulk and tone, full strength in the BUE, BLE SENSORY: normal and symmetric to light touch, pinprick, temperature, vibration; EXCEPT DECR IN LEFT GREAT TOE AND MEDIAL PLANTAR FOOT TO PP AND TEMP. COORDINATION: finger-nose-finger, fine finger  movements, heel-shin normal REFLEXES: BUE 1, KNEES 1, ANKLES 0. DOWN GOING TOES. GAIT/STATION: narrow based gait; able to walk on toes, heels and tandem; romberg is negative    DIAGNOSTIC DATA (LABS, IMAGING, TESTING) - I reviewed patient records, labs, notes, testing and imaging myself where available.  Lab Results  Component Value Date   HGB 14.6 06/24/2009   HCT 43.0 06/24/2009      Component Value Date/Time   NA 138 06/24/2009 1128   K 4.2 06/24/2009 1128   CL 105 06/24/2009 1128   GLUCOSE 127* 06/24/2009 1128   BUN 14 06/24/2009 1128   CREATININE 0.6 06/24/2009 1128   No results found for this basename: CHOL, HDL, LDLCALC, LDLDIRECT, TRIG, CHOLHDL   No results found for this basename: HGBA1C   No results found for this basename: VITAMINB12   No results found for this basename: TSH      ASSESSMENT AND PLAN  53 y.o. year old female here with left great toe numbness and burning.   Ddx: left lumbar radiculopathy (L4, L5) vs neuropathy (medial plantar)  PLAN: - EMG/NCS - further testing based on results  Orders Placed This Encounter  Procedures  . NCV with EMG(electromyography)   Return for for NCV/EMG.    Penni Bombard, MD 5/32/9924, 26:83 AM Certified in Neurology, Neurophysiology and Neuroimaging  [facility] [address], [place], [place] [address] [phone]

## 2013-08-19 NOTE — Patient Instructions (Signed)
I will check EMG/NCS (electrical nerve test).  Avoid crossing legs and pinching nerves.

## 2013-09-16 ENCOUNTER — Encounter: Payer: BC Managed Care – PPO | Admitting: Radiology

## 2013-09-16 ENCOUNTER — Encounter: Payer: BC Managed Care – PPO | Admitting: Diagnostic Neuroimaging

## 2013-09-23 ENCOUNTER — Encounter: Payer: BC Managed Care – PPO | Admitting: Radiology

## 2013-09-23 ENCOUNTER — Encounter: Payer: BC Managed Care – PPO | Admitting: Diagnostic Neuroimaging

## 2014-03-05 ENCOUNTER — Ambulatory Visit: Payer: BC Managed Care – PPO

## 2014-03-05 ENCOUNTER — Ambulatory Visit (INDEPENDENT_AMBULATORY_CARE_PROVIDER_SITE_OTHER): Payer: BC Managed Care – PPO | Admitting: Podiatry

## 2014-03-05 VITALS — BP 137/94 | HR 66 | Resp 16

## 2014-03-05 DIAGNOSIS — L84 Corns and callosities: Secondary | ICD-10-CM

## 2014-03-05 DIAGNOSIS — R52 Pain, unspecified: Secondary | ICD-10-CM

## 2014-03-05 DIAGNOSIS — M722 Plantar fascial fibromatosis: Secondary | ICD-10-CM

## 2014-03-05 NOTE — Patient Instructions (Signed)

## 2014-03-06 NOTE — Progress Notes (Signed)
Subjective:     Patient ID: Joyce Weaver, female   DOB: 04-21-1960, 54 y.o.   MRN: [id number]  HPI patient presents with painful lesion left with plantar flexion of the metatarsal and inability to walk comfortably. Also complains of pain in the bottom of the right heel   Review of Systems     Objective:   Physical Exam Neurovascular status intact with muscle strength adequate range of motion within normal limits. Patient is noted to have moderate discomfort plantar right heel and a keratotic lesion plantar left that's painful when pressed and hard for her to walk on. Digits are well-perfused    Assessment:     Probable lesion secondary to inflammatory condition and also plantar fasciitis right    Plan:     Discussed both conditions and at this time for the left I did go ahead today and did deep debridement of lesion with no iatrogenic bleeding noted and reappoint to recheck

## 2014-03-19 ENCOUNTER — Other Ambulatory Visit: Payer: Self-pay | Admitting: Obstetrics and Gynecology

## 2014-03-23 LAB — CYTOLOGY - PAP

## 2014-04-09 ENCOUNTER — Other Ambulatory Visit: Payer: Self-pay | Admitting: Physician Assistant

## 2014-04-09 DIAGNOSIS — Z1231 Encounter for screening mammogram for malignant neoplasm of breast: Secondary | ICD-10-CM

## 2014-04-14 ENCOUNTER — Ambulatory Visit
Admission: RE | Admit: 2014-04-14 | Discharge: 2014-04-14 | Disposition: A | Discharge status: Home / Self Care | Payer: BC Managed Care – PPO | Source: Ambulatory Visit | Attending: Physician Assistant | Admitting: Physician Assistant

## 2014-04-14 DIAGNOSIS — Z1231 Encounter for screening mammogram for malignant neoplasm of breast: Secondary | ICD-10-CM

## 2014-04-15 ENCOUNTER — Telehealth: Payer: Self-pay | Admitting: Internal Medicine

## 2014-04-15 NOTE — Telephone Encounter (Signed)
Received records from [place] for appointment with Dr Debara Pickett on 05/08/14.  Records given to [facility] (medical records) for Dr Lysbeth Penner schedule on 05/08/14.  lp

## 2014-04-16 ENCOUNTER — Ambulatory Visit: Payer: BC Managed Care – PPO

## 2014-05-08 ENCOUNTER — Encounter: Payer: Self-pay | Admitting: Internal Medicine

## 2014-05-08 ENCOUNTER — Other Ambulatory Visit: Payer: Self-pay | Admitting: *Deleted

## 2014-05-08 ENCOUNTER — Ambulatory Visit (INDEPENDENT_AMBULATORY_CARE_PROVIDER_SITE_OTHER): Payer: BC Managed Care – PPO | Admitting: Internal Medicine

## 2014-05-08 VITALS — BP 114/74 | HR 73 | Ht 68.0 in | Wt 133.0 lb

## 2014-05-08 DIAGNOSIS — R0789 Other chest pain: Secondary | ICD-10-CM

## 2014-05-08 DIAGNOSIS — Q676 Pectus excavatum: Secondary | ICD-10-CM

## 2014-05-08 DIAGNOSIS — R011 Cardiac murmur, unspecified: Secondary | ICD-10-CM

## 2014-05-08 DIAGNOSIS — R079 Chest pain, unspecified: Secondary | ICD-10-CM

## 2014-05-08 DIAGNOSIS — R010 Benign and innocent cardiac murmurs: Secondary | ICD-10-CM

## 2014-05-08 NOTE — Patient Instructions (Signed)
Your physician recommends that you schedule a follow-up appointment in: As Needed  Your physician has requested that you have an echocardiogram. Echocardiography is a painless test that uses sound waves to create images of your heart. It provides your doctor with information about the size and shape of your heart and how well your heart's chambers and valves are working. This procedure takes approximately one hour. There are no restrictions for this procedure.

## 2014-05-08 NOTE — Progress Notes (Signed)
OFFICE NOTE  Chief Complaint:  Chest pain, murmur  Primary Care Physician: Chesley Noon, MD  HPI:  Joyce Weaver is a pleasant 54 year old female who was previously seen in 2012 by Dr. Aundra Weaver, and therefore is a new patient to our practice. At that time she complained of a murmur and was having some chest discomfort. She underwent an echocardiogram which showed a flat coaptation of the mitral valve and no significant mitral regurgitation. She's done fairly well although recently has developed some chest pain. She noted some of that after shoveling snow this winter. She's also had a few episodes where she's had chest discomfort, on after exertion but more noticeable after using her upper body. 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 This is well cause chest pain which is more skeletal in nature. She's also desiring follow-up of her murmur.  PMHx:  Past Medical History  Diagnosis Date  . GERD (gastroesophageal reflux disease)   . 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 Anxiety   . Personal history of other diseases of circulatory system   . Depression   . Emphysema of lung   . Heart murmur   . Hemorrhoids     Past Surgical History  Procedure Laterality Date  . Nose surgery    . Elbow surgery      FAMHx:  Family History  Problem Relation Age of Onset  . Coronary artery disease      no premature Hx  . Cancer Brother     prostate  . Cancer Maternal Aunt     breast  . Colon cancer Neg Hx   . Rectal cancer Neg Hx   . Stomach cancer Neg Hx   . Esophageal cancer Neg Hx   . Other Father     natural  . Stroke Mother     SOCHx:   reports that she has never smoked. She has never used smokeless tobacco. She reports that she does not drink alcohol or use illicit drugs.  ALLERGIES:  No Known Allergies  ROS: A comprehensive review of systems was negative except for:  Cardiovascular: positive for chest pain  HOME MEDS: Current Outpatient Prescriptions  Medication Sig Dispense Refill  . cholecalciferol (VITAMIN D) 1000 UNITS tablet Take 1,000 Units by mouth daily.    Marland Kitchen estradiol-norethindrone (ACTIVELLA) 1-0.5 MG per tablet Take 1 tablet by mouth daily.     No current facility-administered medications for this visit.    LABS/IMAGING: No results found for this or any previous visit (from the past 48 hour(s)). No results found.  VITALS: BP 114/74 mmHg  Pulse 73  Ht 5\' 8"  (1.727 m)  Wt 133 lb (60.328 kg)  BMI 20.23 kg/m2  EXAM: General appearance: alert and no distress Neck: no carotid bruit and no JVD Lungs: clear to auscultation bilaterally Heart: regular rate and rhythm, S1, S2 normal and systolic murmur: early systolic 2/6, crescendo at lower left sternal border Abdomen: soft, non-tender; bowel sounds normal; no masses,  no organomegaly Extremities: Pectus excavatum Pulses: 2+ and symmetric Skin: Skin color, texture, turgor normal. No rashes or lesions Neurologic: Grossly normal Psych: Pleasant, mildly anxious  EKG: Normal sinus rhythm at 73  ASSESSMENT: 1. Atypical chest pain-likely secondary to pectus deformity 2. Mitral murmur  PLAN: 1.   Joyce Weaver is having chest pain which sounds atypical and musculoskeletal, possibly related to her pectus deformity. This is  not uncommon. She also has a mitral murmur. It's difficult to judge whether this is louder than had been previously described, however in the past she had no evidence for any regurgitation. There was abnormality to the mitral valve. I would like to repeat her echocardiogram and also to confirm a normal wall motion, which would make her chest pain to be less likely cardiac.  I will contact her with the results of her echo. If there marked abnormalities, will need to see her back in the office.  Joyce Casino, MD, [facility] Attending Cardiologist [facility]  Joyce Weaver 05/08/2014, 5:51 PM

## 2014-05-11 ENCOUNTER — Telehealth (HOSPITAL_COMMUNITY): Payer: Self-pay | Admitting: *Deleted

## 2014-05-12 ENCOUNTER — Telehealth (HOSPITAL_COMMUNITY): Payer: Self-pay | Admitting: *Deleted

## 2014-05-12 DIAGNOSIS — R0789 Other chest pain: Secondary | ICD-10-CM

## 2014-05-12 NOTE — Telephone Encounter (Signed)
Just a POET .Marland Kitchen Her chest pain is very atypical for angina.  [place]

## 2014-05-12 NOTE — Telephone Encounter (Signed)
Dr. Debara Pickett - what stress test were you thinking of? I saw where an echo was ordered and scheduled.Marland Kitchen

## 2014-05-12 NOTE — Telephone Encounter (Signed)
Test ordered

## 2014-05-12 NOTE — Telephone Encounter (Signed)
This patient has decided to have the stress test that Dr. Debara Pickett spoke about during her office visit. Can you please enter an order so that she can get scheduled.

## 2014-05-14 ENCOUNTER — Telehealth (HOSPITAL_COMMUNITY): Payer: Self-pay | Admitting: *Deleted

## 2014-05-25 ENCOUNTER — Ambulatory Visit (HOSPITAL_COMMUNITY): Payer: BC Managed Care – PPO

## 2014-05-28 ENCOUNTER — Telehealth (HOSPITAL_COMMUNITY): Payer: Self-pay | Admitting: *Deleted

## 2014-06-09 ENCOUNTER — Encounter (HOSPITAL_COMMUNITY): Payer: Self-pay | Admitting: *Deleted

## 2014-06-16 ENCOUNTER — Encounter (HOSPITAL_COMMUNITY): Payer: BC Managed Care – PPO

## 2014-06-16 ENCOUNTER — Ambulatory Visit (HOSPITAL_COMMUNITY): Payer: BC Managed Care – PPO

## 2014-06-24 ENCOUNTER — Telehealth (HOSPITAL_COMMUNITY): Payer: Self-pay

## 2014-06-24 NOTE — Telephone Encounter (Signed)
Encounter complete. 

## 2014-06-26 ENCOUNTER — Encounter (HOSPITAL_COMMUNITY): Payer: BC Managed Care – PPO

## 2014-06-26 ENCOUNTER — Ambulatory Visit (HOSPITAL_COMMUNITY): Payer: BC Managed Care – PPO

## 2014-06-29 ENCOUNTER — Telehealth (HOSPITAL_COMMUNITY): Payer: Self-pay | Admitting: *Deleted

## 2014-07-03 ENCOUNTER — Other Ambulatory Visit: Payer: Self-pay | Admitting: Internal Medicine

## 2014-07-03 DIAGNOSIS — R0789 Other chest pain: Secondary | ICD-10-CM

## 2014-08-21 ENCOUNTER — Telehealth (HOSPITAL_COMMUNITY): Payer: Self-pay

## 2014-08-21 NOTE — Telephone Encounter (Signed)
Encounter complete. 

## 2014-08-25 ENCOUNTER — Telehealth (HOSPITAL_COMMUNITY): Payer: Self-pay

## 2014-08-25 NOTE — Telephone Encounter (Signed)
Encounter complete. 

## 2014-08-26 ENCOUNTER — Ambulatory Visit (HOSPITAL_COMMUNITY)
Admission: RE | Admit: 2014-08-26 | Discharge: 2014-08-26 | Disposition: A | Discharge status: Home / Self Care | Payer: BC Managed Care – PPO | Source: Ambulatory Visit | Attending: Internal Medicine | Admitting: Internal Medicine

## 2014-08-26 ENCOUNTER — Ambulatory Visit (HOSPITAL_BASED_OUTPATIENT_CLINIC_OR_DEPARTMENT_OTHER)
Admission: RE | Admit: 2014-08-26 | Discharge: 2014-08-26 | Disposition: A | Discharge status: Home / Self Care | Payer: BC Managed Care – PPO | Source: Ambulatory Visit | Attending: Internal Medicine | Admitting: Internal Medicine

## 2014-08-26 DIAGNOSIS — K219 Gastro-esophageal reflux disease without esophagitis: Secondary | ICD-10-CM | POA: Diagnosis not present

## 2014-08-26 DIAGNOSIS — R011 Cardiac murmur, unspecified: Secondary | ICD-10-CM | POA: Diagnosis not present

## 2014-08-26 DIAGNOSIS — R0789 Other chest pain: Secondary | ICD-10-CM

## 2014-08-26 DIAGNOSIS — R079 Chest pain, unspecified: Secondary | ICD-10-CM | POA: Diagnosis not present

## 2014-08-26 LAB — EXERCISE TOLERANCE TEST
CSEPED: 10 min
CSEPEDS: 1 s
CSEPEW: 11.7 METS
MPHR: 169 {beats}/min
Peak HR: 157 {beats}/min
Percent HR: 92 %
RPE: 15
Rest HR: 64 {beats}/min

## 2014-10-06 ENCOUNTER — Other Ambulatory Visit: Payer: Self-pay | Admitting: Obstetrics & Gynecology

## 2014-11-24 ENCOUNTER — Other Ambulatory Visit: Payer: Self-pay | Admitting: *Deleted

## 2014-11-24 ENCOUNTER — Ambulatory Visit (INDEPENDENT_AMBULATORY_CARE_PROVIDER_SITE_OTHER): Payer: BC Managed Care – PPO | Admitting: Neurology

## 2014-11-24 DIAGNOSIS — R200 Anesthesia of skin: Secondary | ICD-10-CM

## 2014-11-24 DIAGNOSIS — G609 Hereditary and idiopathic neuropathy, unspecified: Secondary | ICD-10-CM

## 2014-11-24 NOTE — Procedures (Signed)
[facility]  [place], [address]  [place], [place] [address] Tel: [phone] Fax:  [phone] Test Date:  11/24/2014  Patient: Joyce Weaver DOB: [birth year] Physician: Narda Amber, DO  Sex: Female Height: 5\' 8"  Ref Phys: Anastasia Pall, M.D.  ID#: [id number] Temp: 33.4C Technician: Jerilynn Mages. Dean   Patient Complaints: This is a 54 year old female presenting for evaluation of bilateral feet numbness, worse on left.   NCV & EMG Findings: Extensive diagnostic evaluation of the left lower extremity and additional studies of the right shows: 1. Right sural sensory response is absent. Left sural sensory response is reduced in amplitude. Bilateral superficial peroneal sensory responses are low normal. 2. Bilateral peroneal motor responses recording at the extensor digitorum brevis are barely present; however, when recording at the tibialis anterior, the motor responses within normal limits. Bilateral tibial motor responses are within normal limits. 3. Bilateral tibial H and F reflexes studies are prolonged. 4. Sparse chronic motor axon loss changes are seen affecting the tibialis anterior and flexor digitorum longus muscles bilaterally, without accompanied active denervation.  Impression: 1. 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 Overall, these findings are moderate in degree electrically. 2. There is no evidence of a superimposed lumbosacral radiculopathy.   ___________________________ Narda Amber, DO    Nerve Conduction Studies Anti Sensory Summary Table   Site NR Peak (ms) Norm Peak (ms) P-T Amp (V) Norm P-T Amp  Left Sup Peroneal Anti Sensory (Ant Lat Mall)  12 cm    3.9 <4.6 4.3 >4  Right Sup Peroneal Anti Sensory (Ant Lat Mall)  12 cm    4.1 <4.6 4.0 >4  Left Sural Anti Sensory (Lat Mall)  Calf    3.9 <4.6 2.8 >4  Right Sural Anti Sensory (Lat  Mall)  Calf NR  <4.6  >4   Motor Summary Table   Site NR Onset (ms) Norm Onset (ms) O-P Amp (mV) Norm O-P Amp Site1 Site2 Delta-0 (ms) Dist (cm) Vel (m/s) Norm Vel (m/s)  Left Peroneal Motor (Ext Dig Brev)  Ankle    8.2 <6.0 0.5 >2.5 B Fib Ankle 8.5 33.0 39 >40  B Fib    16.7  0.7  Poplt B Fib 2.1 10.0 48 >40  Poplt    18.8  0.8         Right Peroneal Motor (Ext Dig Brev)  Ankle    5.1 <6.0 0.3 >2.5 B Fib Ankle 7.7 34.0 44 >40  B Fib    12.8  0.3  Poplt B Fib 2.2 10.0 45 >40  Poplt    15.0  0.3         Left Peroneal TA Motor (Tib Ant)  Fib Head    3.1 <4.5 4.2 >3 Poplit Fib Head 1.8 10.0 56 >40  Poplit    4.9  4.1         Right Peroneal TA Motor (Tib Ant)  Fib Head    3.0 <4.5 4.2 >3 Poplit Fib Head 1.8 10.0 56 >40  Poplit    4.8  4.0         Left Tibial Motor (Abd Hall Brev)  Ankle    4.1 <6.0 7.0 >4 Knee Ankle 10.4 42.0 40 >40  Knee    14.5  3.6         Right Tibial Motor (Abd Hall Brev)  Ankle    4.1 <6.0 9.1 >4 Knee Ankle 10.6 42.0  40 >40  Knee    14.7  6.0          F Wave Studies   NR F-Lat (ms) Lat Norm (ms) L-R F-Lat (ms)  Left Tibial (Mrkrs) (Abd Hallucis)     65.76 <55 0.00  Right Tibial (Mrkrs) (Abd Hallucis)     65.76 <55 0.00   H Reflex Studies   NR H-Lat (ms) Lat Norm (ms) L-R H-Lat (ms)  Left Tibial (Gastroc)     41.22 <35 0.81  Right Tibial (Gastroc)     40.41 <35 0.81   EMG   Side Muscle Ins Act Fibs Psw Fasc Number Recrt Dur Dur. Amp Amp. Poly Poly. Comment  Left AntTibialis Nml Nml Nml Nml 1- Mod-R Few 1+ Few 1+ Nml Nml N/A  Left Gastroc Nml Nml Nml Nml Nml Nml Nml Nml Nml Nml Nml Nml N/A  Left Flex Dig Long Nml Nml Nml Nml 1- Mod-R Few 1+ Few 1+ Nml Nml N/A  Left RectFemoris Nml Nml Nml Nml Nml Nml Nml Nml Nml Nml Nml Nml N/A  Left GluteusMed Nml Nml Nml Nml Nml Nml Nml Nml Nml Nml Nml Nml N/A  Right AntTibialis Nml Nml Nml Nml 1- Mod-R Few 1+ Few 1+ Nml Nml N/A  Right Gastroc Nml Nml Nml Nml Nml Nml Nml Nml Nml Nml Nml Nml N/A  Right  RectFemoris Nml Nml Nml Nml Nml Nml Nml Nml Nml Nml Nml Nml N/A  Right GluteusMed Nml Nml Nml Nml Nml Nml Nml Nml Nml Nml Nml Nml N/A  Right Flex Dig Long Nml Nml Nml Nml 1- Mod-R Few 1+ Few 1+ Nml Nml N/A      Waveforms:

## 2014-11-30 ENCOUNTER — Telehealth: Payer: Self-pay | Admitting: Neurology

## 2014-11-30 NOTE — Telephone Encounter (Signed)
Left message informing patient that we have sent the results to the ordering doctor and she would need to call him.

## 2014-11-30 NOTE — Telephone Encounter (Signed)
PT called for the results of her EMG/Dawn CB# [phone]

## 2015-03-12 ENCOUNTER — Other Ambulatory Visit: Payer: Self-pay

## 2015-03-12 DIAGNOSIS — Z1231 Encounter for screening mammogram for malignant neoplasm of breast: Secondary | ICD-10-CM

## 2015-03-18 ENCOUNTER — Encounter: Payer: Self-pay | Admitting: Podiatry

## 2015-03-18 ENCOUNTER — Ambulatory Visit (INDEPENDENT_AMBULATORY_CARE_PROVIDER_SITE_OTHER): Payer: BC Managed Care – PPO | Admitting: Podiatry

## 2015-03-18 VITALS — BP 114/80 | HR 71 | Resp 12

## 2015-03-18 DIAGNOSIS — L84 Corns and callosities: Secondary | ICD-10-CM

## 2015-03-18 NOTE — Progress Notes (Signed)
Subjective:     Patient ID: Joyce Weaver, female   DOB: April 11, 1960, 55 y.o.   MRN: [id number]  HPI patient presents with lesions plantar left that can become painful   Review of Systems     Objective:   Physical Exam Neurovascular status intact with keratotic lesions noted    Assessment:     Lesion secondary to pressure    Plan:     Debris lesions with no iatrogenic and reappoint as needed

## 2015-03-23 ENCOUNTER — Other Ambulatory Visit: Payer: Self-pay | Admitting: Obstetrics & Gynecology

## 2015-03-24 LAB — CYTOLOGY - PAP

## 2015-04-19 ENCOUNTER — Ambulatory Visit: Payer: BC Managed Care – PPO

## 2016-01-03 ENCOUNTER — Other Ambulatory Visit: Payer: Self-pay | Admitting: Physician Assistant

## 2016-01-03 DIAGNOSIS — Z1231 Encounter for screening mammogram for malignant neoplasm of breast: Secondary | ICD-10-CM

## 2016-01-14 ENCOUNTER — Ambulatory Visit (INDEPENDENT_AMBULATORY_CARE_PROVIDER_SITE_OTHER): Payer: BLUE CROSS/BLUE SHIELD | Admitting: Podiatry

## 2016-01-14 DIAGNOSIS — L84 Corns and callosities: Secondary | ICD-10-CM | POA: Diagnosis not present

## 2016-01-14 DIAGNOSIS — M722 Plantar fascial fibromatosis: Secondary | ICD-10-CM | POA: Diagnosis not present

## 2016-01-17 NOTE — Progress Notes (Signed)
Subjective:     Patient ID: Joyce Weaver, female   DOB: [birth year], 55 y.o.   MRN: [id number]  HPI patient presents stating that she still is getting pain in the plantar aspect of the heel right with inflammation fluid buildup and also has lesion formation that's quite thickened bilateral   Review of Systems     Objective:   Physical Exam Neurovascular status intact muscle strength adequate continued inflammation plantar aspect right heel at the insertional point of the tendon the calcaneus with keratotic lesion underneath the metatarsals which remains painful when palpated    Assessment:     Fasciitis-like symptoms still present with lesion formation bilateral    Plan:     Advised on physical therapy anti-inflammatories and scanned for custom orthotics to reduce plantar pressure against the callus formation in the heel. Debrided lesions bilateral with no iatrogenic bleeding noted

## 2016-01-18 ENCOUNTER — Ambulatory Visit: Payer: BC Managed Care – PPO

## 2016-02-04 ENCOUNTER — Ambulatory Visit
Admission: RE | Admit: 2016-02-04 | Discharge: 2016-02-04 | Disposition: A | Discharge status: Home / Self Care | Payer: Self-pay | Source: Ambulatory Visit | Attending: Physician Assistant | Admitting: Physician Assistant

## 2016-02-04 ENCOUNTER — Ambulatory Visit (INDEPENDENT_AMBULATORY_CARE_PROVIDER_SITE_OTHER): Payer: BLUE CROSS/BLUE SHIELD | Admitting: Podiatry

## 2016-02-04 DIAGNOSIS — M722 Plantar fascial fibromatosis: Secondary | ICD-10-CM | POA: Diagnosis not present

## 2016-02-04 DIAGNOSIS — L84 Corns and callosities: Secondary | ICD-10-CM | POA: Diagnosis not present

## 2016-02-04 DIAGNOSIS — Z1231 Encounter for screening mammogram for malignant neoplasm of breast: Secondary | ICD-10-CM

## 2016-02-04 NOTE — Patient Instructions (Signed)

## 2016-02-06 NOTE — Progress Notes (Signed)
Subjective:     Patient ID: Joyce Weaver, female   DOB: [birth year], 55 y.o.   MRN: [id number]  HPI patient has callus formation bilateral and presents to pickup orthotics   Review of Systems     Objective:   Physical Exam Neurovascular status intact with plantar keratotic lesions bilateral that are moderately tender and history of these    Assessment:     Lesion secondary to pressure with probable porokeratotic component    Plan:     H&P condition reviewed debridement accomplished orthotics dispensed with instructions on usage. Reappoint to recheck

## 2016-02-10 ENCOUNTER — Ambulatory Visit: Payer: BC Managed Care – PPO | Admitting: Internal Medicine

## 2016-02-10 ENCOUNTER — Encounter: Payer: Self-pay | Admitting: Internal Medicine

## 2016-02-10 ENCOUNTER — Ambulatory Visit (INDEPENDENT_AMBULATORY_CARE_PROVIDER_SITE_OTHER): Payer: BLUE CROSS/BLUE SHIELD | Admitting: Internal Medicine

## 2016-02-10 VITALS — BP 92/70 | HR 72 | Ht 68.0 in | Wt 125.2 lb

## 2016-02-10 DIAGNOSIS — R0789 Other chest pain: Secondary | ICD-10-CM | POA: Diagnosis not present

## 2016-02-10 DIAGNOSIS — Q676 Pectus excavatum: Secondary | ICD-10-CM

## 2016-02-10 DIAGNOSIS — R011 Cardiac murmur, unspecified: Secondary | ICD-10-CM

## 2016-02-10 NOTE — Patient Instructions (Signed)
Your physician wants you to follow-up in: ONE YEAR with Dr. Hilty. You will receive a reminder letter in the mail two months in advance. If you don't receive a letter, please call our office to schedule the follow-up appointment.  

## 2016-02-10 NOTE — Progress Notes (Signed)
OFFICE NOTE  Chief Complaint:  Murmur  Primary Care Physician: Chesley Noon, MD  HPI:  Joyce Weaver is a pleasant 55 year old female who was previously seen in 2012 by Dr. Aundra Dubin, and therefore is a new patient to our practice. At that time she complained of a murmur and was having some chest discomfort. She underwent an echocardiogram which showed a flat coaptation of the mitral valve and no significant mitral regurgitation. She's done fairly well although recently has developed some chest pain. She noted some of that after shoveling snow this winter. She's also had a few episodes where she's had chest discomfort, on after exertion but more noticeable after using her upper body. 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 This is well cause chest pain which is more skeletal in nature. She's also desiring follow-up of her murmur.  02/10/2016  Joyce Weaver returns for follow-up of her heart murmur. She occasionally has some sharp chest discomfort which is similar to what she's had the past. She had an echo last year which showed systolic function, systolic bowing of the mitral leaflets without prolapse and no significant regurgitation. We discussed that she does not necessarily need echocardiograms on a regular basis based on these findings. She is thin with few cardiac risk factors and maintain the low blood pressure.  PMHx:  Past Medical History:  Diagnosis Date  . Anxiety   . Depression   . Emphysema of lung ([place])   . GERD (gastroesophageal reflux disease)   . Heart murmur   . Hemorrhoids   . 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 Personal history of other diseases of circulatory system     Past Surgical History:  Procedure Laterality Date  . ELBOW SURGERY    . NOSE SURGERY      FAMHx:  Family History  Problem Relation Age of Onset  . Cancer Brother     prostate  . Cancer  Maternal Aunt     breast  . Other Father     natural  . Stroke Mother   . Coronary artery disease      no premature Hx  . Colon cancer Neg Hx   . Rectal cancer Neg Hx   . Stomach cancer Neg Hx   . Esophageal cancer Neg Hx     SOCHx:   reports that she has never smoked. She has never used smokeless tobacco. She reports that she does not drink alcohol or use drugs.  ALLERGIES:  No Known Allergies  ROS: Pertinent items noted in HPI and remainder of comprehensive ROS otherwise negative.  HOME MEDS: Current Outpatient Prescriptions  Medication Sig Dispense Refill  . cholecalciferol (VITAMIN D) 1000 UNITS tablet Take 1,000 Units by mouth daily.    Marland Kitchen estradiol-norethindrone (ACTIVELLA) 1-0.5 MG per tablet Take 1 tablet by mouth daily.     No current facility-administered medications for this visit.     LABS/IMAGING: No results found for this or any previous visit (from the past 48 hour(s)). No results found.  VITALS: BP 92/70   Pulse 72   Ht 5\' 8"  (1.727 m)   Wt 125 lb 3.2 oz (56.8 kg)   SpO2 98%   BMI 19.04 kg/m   EXAM: General appearance: alert and no distress Neck: no carotid bruit and no JVD Lungs: clear to auscultation bilaterally Heart: regular rate and rhythm, S1, S2 normal and systolic murmur: early  systolic 2/6, crescendo at lower left sternal border Abdomen: soft, non-tender; bowel sounds normal; no masses,  no organomegaly Extremities: Pectus excavatum Pulses: 2+ and symmetric Skin: Skin color, texture, turgor normal. No rashes or lesions Neurologic: Grossly normal Psych: Pleasant, mildly anxious  EKG: Normal sinus rhythm at 67  ASSESSMENT: 1. Atypical chest pain-likely secondary to pectus deformity 2. Mitral murmur  PLAN: 1.   Joyce Weaver continues to have some intermittent chest discomfort which is not necessarily anginal. She has few cardiac risk factors. Her echo was fairly stable without any significant valvular regurgitation although she does  have a very soft mitral murmur. There was no significant prolapse on her recent echo. It's reasonable to consider repeating that to look for stability of the mitral valve in 1-2 years. Follow-up with me annually or sooner as necessary.  Pixie Casino, MD, [facility] Attending Cardiologist [facility]ster[facility] 02/10/2016, 10:55 AM

## 2018-02-17 ENCOUNTER — Encounter: Payer: Self-pay | Admitting: Gastroenterology

## 2018-03-24 IMAGING — MG DIGITAL SCREENING BILATERAL MAMMOGRAM WITH CAD
5 series · 5 of 5 positions shown · non-contrast
Comparison: Previous exam(s).

CLINICAL DATA: Screening.

EXAM:
DIGITAL SCREENING BILATERAL MAMMOGRAM WITH CAD

[L MLO]
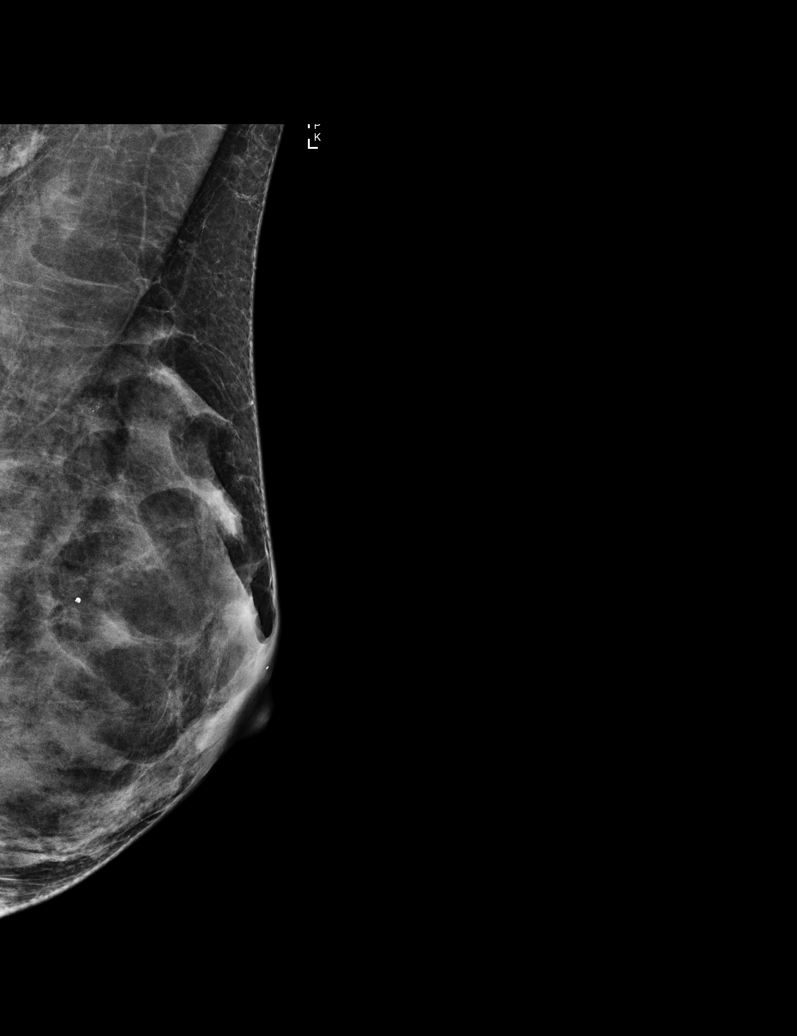

[R CC (1 of 2)]
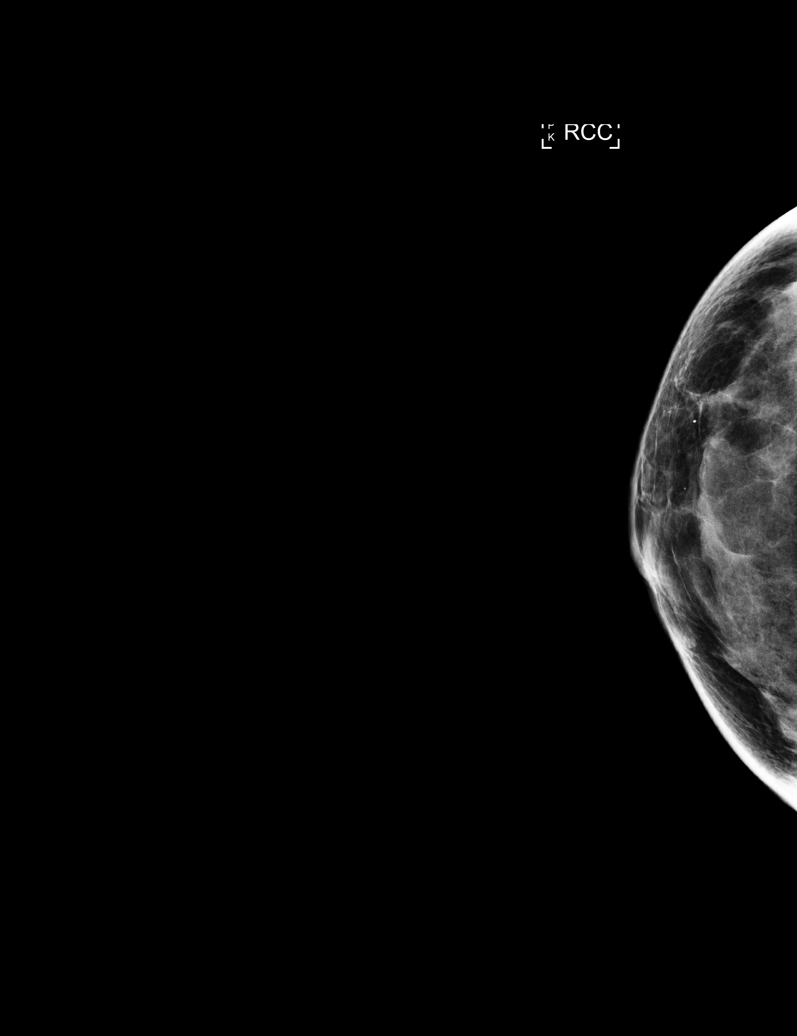

[L CC]
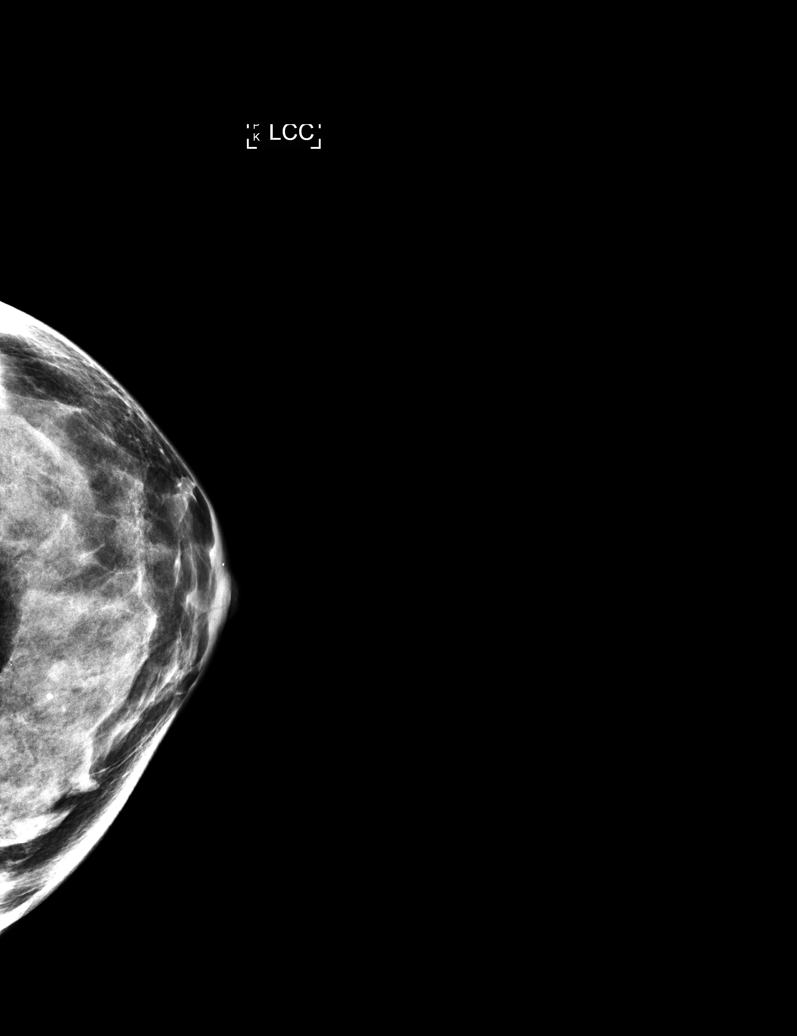

[R CC (2 of 2)]
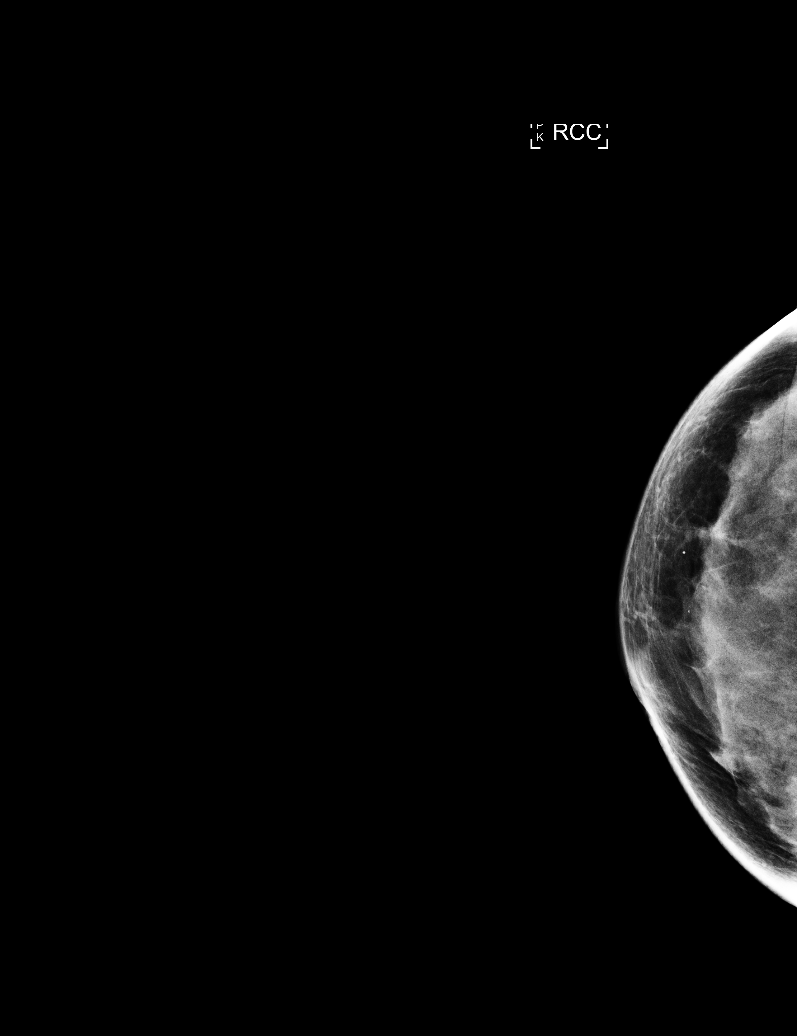

[R MLO]
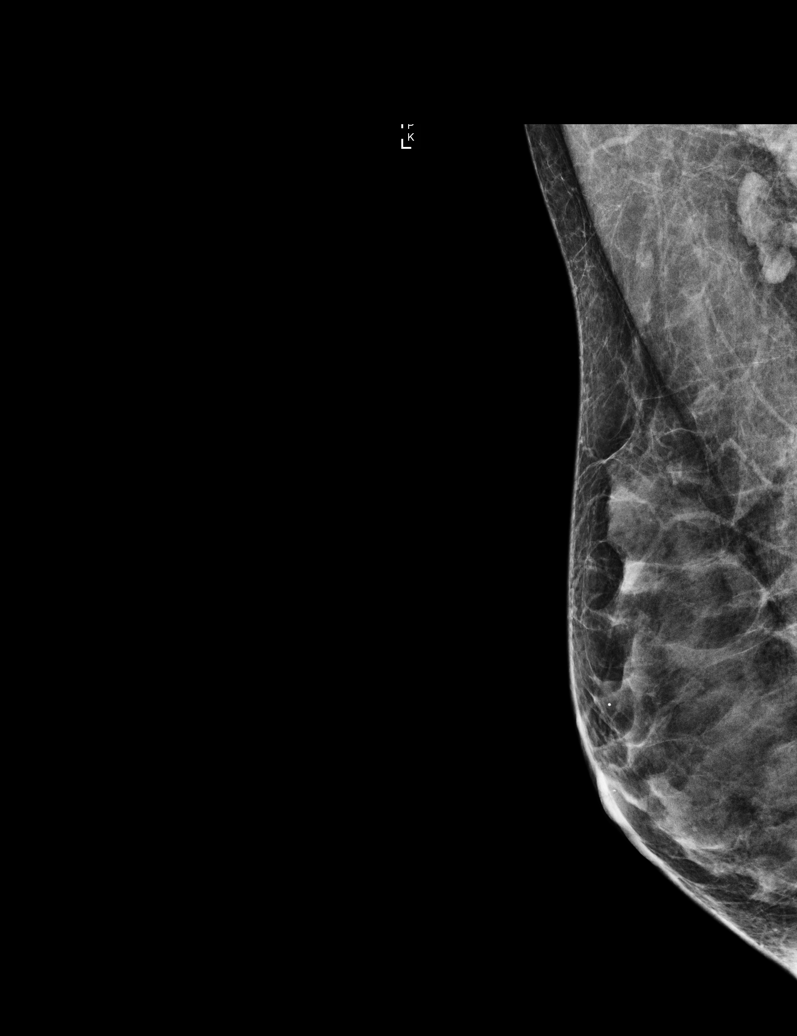

[5 of 5 positions shown; findings below may reference images not displayed]

ACR Breast Density Category d: The breast tissue is extremely dense,
which lowers the sensitivity of mammography.
FINDINGS: There are no findings suspicious for malignancy. Images were
processed with CAD.
IMPRESSION: No mammographic evidence of malignancy. A result letter of this
screening mammogram will be mailed directly to the patient.

RECOMMENDATION:
Screening mammogram in one year. (Code:BD-D-K0F)

BI-RADS CATEGORY  1: Negative.
